# Patient Record
Sex: Female | Born: 1950 | Race: Black or African American | Hispanic: No | Marital: Married | Smoking: Never smoker
Health system: Southern US, Community
[De-identification: ages and names within clinical notes are randomized; demographics above are authoritative.]

---

## 2015-04-04 ENCOUNTER — Emergency Department (HOSPITAL_BASED_OUTPATIENT_CLINIC_OR_DEPARTMENT_OTHER): Payer: Self-pay

## 2015-04-04 ENCOUNTER — Emergency Department (HOSPITAL_BASED_OUTPATIENT_CLINIC_OR_DEPARTMENT_OTHER)
Admission: EM | Admit: 2015-04-04 | Discharge: 2015-04-04 | Disposition: A | Discharge status: Home / Self Care | Payer: Self-pay | Attending: Emergency Medicine | Admitting: Emergency Medicine

## 2015-04-04 ENCOUNTER — Encounter (HOSPITAL_BASED_OUTPATIENT_CLINIC_OR_DEPARTMENT_OTHER): Payer: Self-pay | Admitting: Emergency Medicine

## 2015-04-04 DIAGNOSIS — I10 Essential (primary) hypertension: Secondary | ICD-10-CM | POA: Insufficient documentation

## 2015-04-04 DIAGNOSIS — K2970 Gastritis, unspecified, without bleeding: Secondary | ICD-10-CM | POA: Insufficient documentation

## 2015-04-04 DIAGNOSIS — R002 Palpitations: Secondary | ICD-10-CM | POA: Insufficient documentation

## 2015-04-04 DIAGNOSIS — R634 Abnormal weight loss: Secondary | ICD-10-CM | POA: Insufficient documentation

## 2015-04-04 DIAGNOSIS — I951 Orthostatic hypotension: Secondary | ICD-10-CM | POA: Insufficient documentation

## 2015-04-04 LAB — COMPREHENSIVE METABOLIC PANEL
ALT: 20 U/L (ref 14–54)
ANION GAP: 9 (ref 5–15)
AST: 26 U/L (ref 15–41)
Albumin: 4.2 g/dL (ref 3.5–5.0)
Alkaline Phosphatase: 85 U/L (ref 38–126)
BILIRUBIN TOTAL: 0.2 mg/dL — AB (ref 0.3–1.2)
BUN: 15 mg/dL (ref 6–20)
CHLORIDE: 106 mmol/L (ref 101–111)
CO2: 28 mmol/L (ref 22–32)
Calcium: 9.1 mg/dL (ref 8.9–10.3)
Creatinine, Ser: 0.69 mg/dL (ref 0.44–1.00)
GFR calc Af Amer: >60 mL/min (ref >60)
Glucose, Bld: 101 mg/dL — ABNORMAL HIGH (ref 65–99)
POTASSIUM: 3.8 mmol/L (ref 3.5–5.1)
Sodium: 143 mmol/L (ref 135–145)
TOTAL PROTEIN: 8.6 g/dL — AB (ref 6.5–8.1)

## 2015-04-04 LAB — CBC WITH DIFFERENTIAL/PLATELET
BASOS ABS: 0 10*3/uL (ref 0.0–0.1)
BASOS PCT: 0 %
EOS PCT: 1 %
Eosinophils Absolute: 0.1 10*3/uL (ref 0.0–0.7)
HCT: 39.6 % (ref 36.0–46.0)
Hemoglobin: 12.9 g/dL (ref 12.0–15.0)
LYMPHS PCT: 23 %
Lymphs Abs: 2 10*3/uL (ref 0.7–4.0)
MCH: 26.4 pg (ref 26.0–34.0)
MCHC: 32.6 g/dL (ref 30.0–36.0)
MCV: 81 fL (ref 78.0–100.0)
MONO ABS: 0.6 10*3/uL (ref 0.1–1.0)
MONOS PCT: 7 %
Neutro Abs: 6 10*3/uL (ref 1.7–7.7)
Neutrophils Relative %: 69 %
PLATELETS: 223 10*3/uL (ref 150–400)
RBC: 4.89 MIL/uL (ref 3.87–5.11)
RDW: 13.6 % (ref 11.5–15.5)
WBC: 8.8 10*3/uL (ref 4.0–10.5)

## 2015-04-04 LAB — URINALYSIS, ROUTINE W REFLEX MICROSCOPIC
Bilirubin Urine: NEGATIVE
GLUCOSE, UA: NEGATIVE mg/dL
Hgb urine dipstick: NEGATIVE
KETONES UR: NEGATIVE mg/dL
LEUKOCYTES UA: NEGATIVE
NITRITE: NEGATIVE
PROTEIN: NEGATIVE mg/dL
Specific Gravity, Urine: 1.008 (ref 1.005–1.030)
pH: 6.5 (ref 5.0–8.0)

## 2015-04-04 LAB — LIPASE, BLOOD: LIPASE: 32 U/L (ref 11–51)

## 2015-04-04 LAB — TROPONIN I: Troponin I: <0.03 ng/mL (ref <0.031)

## 2015-04-04 MED ORDER — SODIUM CHLORIDE 0.9 % IV BOLUS (SEPSIS)
1000.0000 mL | Freq: Once | INTRAVENOUS | Status: AC
  Administered 2015-04-04: 1000 mL via INTRAVENOUS

## 2015-04-04 MED ORDER — OMEPRAZOLE 20 MG PO CPDR: 20.0000 mg | DELAYED_RELEASE_CAPSULE | Freq: Every day | ORAL | Status: DC

## 2015-04-04 MED ORDER — IOHEXOL 300 MG/ML  SOLN
25.0000 mL | Freq: Once | INTRAMUSCULAR | Status: AC | PRN
  Administered 2015-04-04: 25 mL via ORAL

## 2015-04-04 MED ORDER — IOHEXOL 300 MG/ML  SOLN
100.0000 mL | Freq: Once | INTRAMUSCULAR | Status: AC | PRN
  Administered 2015-04-04: 100 mL via INTRAVENOUS

## 2015-04-04 NOTE — ED Notes (Signed)
Up to b/r, steady gait, NAD, calm, interactive, family present.

## 2015-04-04 NOTE — ED Notes (Signed)
Pt placed on automatic vital signs Q30. 

## 2015-04-04 NOTE — ED Provider Notes (Signed)
CSN: [id number]     Arrival date & time 04/04/15  1515 History   First MD Initiated Contact with Patient 04/04/15 1542     Chief Complaint  Patient presents with  . Abdominal Pain    (Consider location/radiation/quality/duration/timing/severity/associated sxs/prior Treatment) HPI Comments: Patient who is from [place], no reported PMH but is hypertensive here today -- presents with multiple complaints. Main impetus for coming to the ED today was severe dizziness described as a spinning sensation last night which caused the patient not to be able to walk. This was associated with a feeling like her heart was beating rapidly. Also vomiting at times. No chest pain or shortness of breath. No lightheadedness or syncope. No treatments for this. Patient denies signs of stroke including: facial droop, slurred speech, aphasia, weakness/numbness in extremities.   Family also reports several months of decreased appetite, epigastric pain with eating. Pain is described as a burning and does not radiate. No associated fevers. Family reports weight loss but is unable to estimate how much. No change in bowel movements. No difficulty with urination.  Family member reports that patient had severe bleeding after having a tooth pulled in [place] several months ago.  Patient is a 65 y.o. female presenting with abdominal pain. The history is provided by the patient and a relative. The history is limited by a language barrier. A language interpreter was used (family).  Abdominal Pain Associated symptoms: vomiting   Associated symptoms: no chest pain, no cough, no diarrhea, no dysuria, no fever, no nausea, no shortness of breath and no sore throat     History reviewed. No pertinent past medical history. History reviewed. No pertinent past surgical history. History reviewed. No pertinent family history. Social History  Substance Use Topics  . Smoking status: Never Smoker   . Smokeless tobacco: None  . Alcohol Use: No    OB History    No data available     Review of Systems  Constitutional: Positive for activity change, appetite change and unexpected weight change. Negative for fever.  HENT: Negative for congestion, dental problem, rhinorrhea, sinus pressure and sore throat.   Eyes: Negative for photophobia, discharge, redness and visual disturbance.  Respiratory: Negative for cough and shortness of breath.   Cardiovascular: Positive for palpitations. Negative for chest pain.  Gastrointestinal: Positive for vomiting and abdominal pain. Negative for nausea and diarrhea.  Genitourinary: Negative for dysuria.  Musculoskeletal: Negative for myalgias, gait problem, neck pain and neck stiffness.  Skin: Negative for rash.  Neurological: Negative for syncope, speech difficulty, weakness, light-headedness, numbness and headaches.  Psychiatric/Behavioral: Negative for confusion.    Allergies  Review of patient's allergies indicates no known allergies.  Home Medications   Prior to Admission medications   Not on File   BP 196/115 mmHg  Pulse 83  Temp(Src) 98 F (36.7 C) (Oral)  Resp 18  Wt 58.514 kg  SpO2 100%   Physical Exam  Constitutional: She is oriented to person, place, and time. She appears well-developed and well-nourished.  HENT:  Head: Normocephalic and atraumatic.  Right Ear: Tympanic membrane, external ear and ear canal normal.  Left Ear: Tympanic membrane, external ear and ear canal normal.  Nose: Nose normal.  Mouth/Throat: Uvula is midline, oropharynx is clear and moist and mucous membranes are normal.  Eyes: Conjunctivae, EOM and lids are normal. Pupils are equal, round, and reactive to light. Right eye exhibits no discharge. Left eye exhibits no discharge. Right eye exhibits no nystagmus. Left eye exhibits no nystagmus.  Neck: Normal range of motion. Neck supple. No JVD present.  Cardiovascular: Normal rate, regular rhythm and normal heart sounds.   No murmur  heard. Pulmonary/Chest: Effort normal and breath sounds normal. No respiratory distress. She has no wheezes. She has no rales.  Abdominal: Soft. Bowel sounds are normal. There is no tenderness. There is no rebound and no guarding.  Musculoskeletal:       Cervical back: She exhibits normal range of motion, no tenderness and no bony tenderness.  Neurological: She is alert and oriented to person, place, and time. She has normal strength and normal reflexes. No cranial nerve deficit or sensory deficit. She displays a negative Romberg sign. Coordination and gait normal. GCS eye subscore is 4. GCS verbal subscore is 5. GCS motor subscore is 6.  Skin: Skin is warm and dry.  Psychiatric: She has a normal mood and affect.  Nursing note and vitals reviewed.   ED Course  Procedures (including critical care time) Labs Review Labs Reviewed  COMPREHENSIVE METABOLIC PANEL - Abnormal; Notable for the following:    Glucose, Bld 101 (*)    Total Protein 8.6 (*)    Total Bilirubin 0.2 (*)    All other components within normal limits  CBC WITH DIFFERENTIAL/PLATELET  LIPASE, BLOOD  URINALYSIS, ROUTINE W REFLEX MICROSCOPIC (NOT AT [facility])  TROPONIN I    Imaging Review No results found. I have personally reviewed and evaluated these images and lab results as part of my medical decision-making.   EKG Interpretation   Date/Time:  Sunday April 04 2015 16:28:31 EST Ventricular Rate:  89 PR Interval:  163 QRS Duration: 92 QT Interval:  384 QTC Calculation: 467 R Axis:   17 Text Interpretation:  Sinus rhythm Ventricular premature complex No  previous ECGs available Confirmed by Manus Gunning  MD, STEPHEN [phone]) on  04/04/2015 4:44:34 PM       4:13 PM Patient seen and examined. Work-up initiated. Medications ordered.   Vital signs reviewed and are as follows: BP 196/115 mmHg  Pulse 83  Temp(Src) 98 F (36.7 C) (Oral)  Resp 18  Wt 58.514 kg  SpO2 100%  5:55 PM Patient discussed with and seen by  Dr. Manus Gunning. CT head ordered. Labs reassuring. She ambulates well. Suspect she can go home if head CT is negative.   Pending completion of work-up.    MDM   Final diagnoses:  None   Pending completion of work-up. No concerning findings on labs.     Renne Crigler, PA-C 04/04/15 1800  Glynn Octave, MD 04/05/15 [phone]

## 2015-04-04 NOTE — ED Notes (Addendum)
Pt back from radiology, alert, NAD, calm, interactive, resps e/u, speaking in clear complete sentences, no dyspnea noted, skin W&D, BP remains elevated, VSS. (denies: sx or complaints). [facility]

## 2015-04-04 NOTE — ED Notes (Signed)
Appetite is poor, has a lot of nausea after eating.

## 2015-04-04 NOTE — ED Notes (Signed)
Pt not in room, pt in CT.  

## 2015-04-04 NOTE — ED Notes (Addendum)
Patient has had problems with burning and pain to her epigastric region for "a while"  - however in the last week she has had the symptoms worse and it burns when she eats. Patient also reports that she is no dizzy.

## 2015-04-04 NOTE — ED Notes (Signed)
Pt states she feels like her heart is beating fast and had some dizziness. No chest pain. Having a lot of belching

## 2015-04-04 NOTE — ED Provider Notes (Signed)
HPI Comments: Tracey Soto is a 65 y.o. female who presents to the Emergency Department due to a constant, mild, dizziness she describes as the "room spinning" with onset 3 weeks ago and recently worsened. Her relative notes associated, moderate, intermittent, HA, nausea, and epigastric pain s/p eating that her relative describes as "cramps" which she denies currently experiencing. Her relative denies a hx of cardiac issues. Tracey Soto's relative notes she is from [place] and has been in the [place] for 4 weeks and plans to return in February. Her relative denies any fevers, falls, LOC, diarrhea, vomiting, CP, regions of pain or other associated symptoms at this time.     Physical Exam RRR CTAB abd soft and nontender CN 2-12 intact, no ataxia on finger to nose, no nystagmus, 5/5 strength throughout, no pronator drift, Romberg negative, normal gait.  8:39 PM Discussed results of labs and imaging as well as next steps for treatment with Tracey Soto and her family. Tracey Soto strongly advised to be reevaluated before returning to [place]. Return precautions noted. Tracey Soto and her family understood and are agreeable to the plan.   CT head is negative. Blood pressure is improved to 150/96. Patient is tolerating by mouth and ambulatory. No focal neurological deficits.  CT abdominal results discussed with Dr. Carolynne Edouardoth of surgery. He feels this does not require emergent evaluation is likely transient. Patient with a benign abdominal exam and not vomiting. Instructed to return to the ED if she does have abdominal pain or vomiting. She will be referred to a primary care physician for evaluation before she returns to [place]. Start PPI.  I personally performed the services described in this documentation, which was scribed in my presence. The recorded information has been reviewed and is accurate.   Glynn OctaveStephen Jo-Anne Kluth, MD 04/04/15 2330

## 2015-04-04 NOTE — Discharge Instructions (Signed)
Orthostatic Hypotension Keep yourself hydrated, take the stomach medication as prescribed. Follow up with a primary care doctor. Return to the ED if he develop abdominal pain, vomiting or any other concerns. Orthostatic hypotension is a sudden drop in blood pressure. It happens when you quickly stand up from a seated or lying position. You may feel dizzy or light-headed. This can last for just a few seconds or for up to a few minutes. It is usually not a serious problem. However, if this happens frequently or gets worse, it can be a sign of something more serious. CAUSES  Different things can cause orthostatic hypotension, including:   Loss of body fluids (dehydration).  Medicines that lower blood pressure.  Sudden changes in posture, such as standing up quickly after you have been sitting or lying down.  Taking too much of your medicine. SIGNS AND SYMPTOMS   Light-headedness or dizziness.   Fainting or near-fainting.   A fast heart rate.   Weakness.   Feeling tired (fatigue).  DIAGNOSIS  Your health care provider may do several things to help diagnose your condition and identify the cause. These may include:   Taking a medical history and doing a physical exam.  Checking your blood pressure. Your health care provider will check your blood pressure when you are:  Lying down.  Sitting.  Standing.  Using tilt table testing. In this test, you lie down on a table that moves from a lying position to a standing position. You will be strapped onto the table. This test monitors your blood pressure and heart rate when you are in different positions. TREATMENT  Treatment will vary depending on the cause. Possible treatments include:   Changing the dosage of your medicines.  Wearing compression stockings on your lower legs.  Standing up slowly after sitting or lying down.  Eating more salt.  Eating frequent, small meals.  In some cases, getting IV fluids.  Taking  medicine to enhance fluid retention. HOME CARE INSTRUCTIONS  Only take over-the-counter or prescription medicines as directed by your health care provider.  Follow your health care provider's instructions for changing the dosage of your current medicines.  Do not stop or adjust your medicine on your own.  Stand up slowly after sitting or lying down. This allows your body to adjust to the different position.  Wear compression stockings as directed.  Eat extra salt as directed.  Do not add extra salt to your diet unless directed to by your health care provider.  Eat frequent, small meals.  Avoid standing suddenly after eating.  Avoid hot showers or excessive heat as directed by your health care provider.  Keep all follow-up appointments. SEEK MEDICAL CARE IF:  You continue to feel dizzy or light-headed after standing.  You feel groggy or confused.  You feel cold, clammy, or sick to your stomach (nauseous).  You have blurred vision.  You feel short of breath. SEEK IMMEDIATE MEDICAL CARE IF:   You faint after standing.  You have chest pain.  You have difficulty breathing.   You lose feeling or movement in your arms or legs.   You have slurred speech or difficulty talking, or you are unable to talk.  MAKE SURE YOU:   Understand these instructions.  Will watch your condition.  Will get help right away if you are not doing well or get worse.   This information is not intended to replace advice given to you by your health care provider. Make sure you discuss  any questions you have with your health care provider.   Document Released: 02/24/2002 Document Revised: 03/11/2013 Document Reviewed: 12/27/2012 Elsevier Interactive Patient Education [employer]

## 2015-05-01 ENCOUNTER — Encounter (HOSPITAL_BASED_OUTPATIENT_CLINIC_OR_DEPARTMENT_OTHER): Payer: Self-pay | Admitting: *Deleted

## 2015-05-01 ENCOUNTER — Emergency Department (HOSPITAL_BASED_OUTPATIENT_CLINIC_OR_DEPARTMENT_OTHER)
Admission: EM | Admit: 2015-05-01 | Discharge: 2015-05-01 | Disposition: A | Discharge status: Home / Self Care | Payer: Self-pay | Attending: Emergency Medicine | Admitting: Emergency Medicine

## 2015-05-01 DIAGNOSIS — Z79899 Other long term (current) drug therapy: Secondary | ICD-10-CM | POA: Insufficient documentation

## 2015-05-01 DIAGNOSIS — T39395A Adverse effect of other nonsteroidal anti-inflammatory drugs [NSAID], initial encounter: Secondary | ICD-10-CM | POA: Insufficient documentation

## 2015-05-01 DIAGNOSIS — K2970 Gastritis, unspecified, without bleeding: Secondary | ICD-10-CM | POA: Insufficient documentation

## 2015-05-01 DIAGNOSIS — K2960 Other gastritis without bleeding: Secondary | ICD-10-CM

## 2015-05-01 DIAGNOSIS — H81399 Other peripheral vertigo, unspecified ear: Secondary | ICD-10-CM | POA: Insufficient documentation

## 2015-05-01 LAB — HEPATIC FUNCTION PANEL
ALT: 20 U/L (ref 14–54)
AST: 27 U/L (ref 15–41)
Albumin: 4.5 g/dL (ref 3.5–5.0)
Alkaline Phosphatase: 85 U/L (ref 38–126)
BILIRUBIN INDIRECT: 0.5 mg/dL (ref 0.3–0.9)
Bilirubin, Direct: 0.1 mg/dL (ref 0.1–0.5)
TOTAL PROTEIN: 9.1 g/dL — AB (ref 6.5–8.1)
Total Bilirubin: 0.6 mg/dL (ref 0.3–1.2)

## 2015-05-01 LAB — CBC WITH DIFFERENTIAL/PLATELET
BAND NEUTROPHILS: 0 %
BLASTS: 0 %
Basophils Absolute: 0 10*3/uL (ref 0.0–0.1)
Basophils Relative: 0 %
EOS ABS: 0 10*3/uL (ref 0.0–0.7)
EOS PCT: 0 %
HEMATOCRIT: 37.1 % (ref 36.0–46.0)
Hemoglobin: 12.3 g/dL (ref 12.0–15.0)
LYMPHS ABS: 0.8 10*3/uL (ref 0.7–4.0)
LYMPHS PCT: 8 %
MCH: 26 pg (ref 26.0–34.0)
MCHC: 33.2 g/dL (ref 30.0–36.0)
MCV: 78.4 fL (ref 78.0–100.0)
MONOS PCT: 1 %
Metamyelocytes Relative: 0 %
Monocytes Absolute: 0.1 10*3/uL (ref 0.1–1.0)
Myelocytes: 0 %
NEUTROS ABS: 9.4 10*3/uL — AB (ref 1.7–7.7)
NEUTROS PCT: 91 %
OTHER: 0 %
Platelets: 196 10*3/uL (ref 150–400)
Promyelocytes Absolute: 0 %
RBC: 4.73 MIL/uL (ref 3.87–5.11)
RDW: 13.6 % (ref 11.5–15.5)
WBC: 10.3 10*3/uL (ref 4.0–10.5)
nRBC: 0 /100 WBC

## 2015-05-01 LAB — URINE MICROSCOPIC-ADD ON

## 2015-05-01 LAB — BASIC METABOLIC PANEL
Anion gap: 10 (ref 5–15)
BUN: 17 mg/dL (ref 6–20)
CO2: 27 mmol/L (ref 22–32)
Calcium: 9.1 mg/dL (ref 8.9–10.3)
Chloride: 103 mmol/L (ref 101–111)
Creatinine, Ser: 0.76 mg/dL (ref 0.44–1.00)
Glucose, Bld: 124 mg/dL — ABNORMAL HIGH (ref 65–99)
POTASSIUM: 3.8 mmol/L (ref 3.5–5.1)
SODIUM: 140 mmol/L (ref 135–145)

## 2015-05-01 LAB — URINALYSIS, ROUTINE W REFLEX MICROSCOPIC
Bilirubin Urine: NEGATIVE
Glucose, UA: NEGATIVE mg/dL
Ketones, ur: NEGATIVE mg/dL
Leukocytes, UA: NEGATIVE
Nitrite: NEGATIVE
PROTEIN: NEGATIVE mg/dL
Specific Gravity, Urine: 1.014 (ref 1.005–1.030)
pH: 7.5 (ref 5.0–8.0)

## 2015-05-01 LAB — CBG MONITORING, ED: GLUCOSE-CAPILLARY: 95 mg/dL (ref 65–99)

## 2015-05-01 LAB — LIPASE, BLOOD: Lipase: 28 U/L (ref 11–51)

## 2015-05-01 MED ORDER — SUCRALFATE 1 G PO TABS: 1.0000 g | ORAL_TABLET | Freq: Three times a day (TID) | ORAL | Status: DC

## 2015-05-01 MED ORDER — MECLIZINE HCL 25 MG PO TABS: 25.0000 mg | ORAL_TABLET | Freq: Three times a day (TID) | ORAL | Status: DC | PRN

## 2015-05-01 NOTE — ED Notes (Signed)
Pt reports dizziness and nausea/vomiting that started yesterday.  Hx of same.  Ambulatory, no deficits noted.

## 2015-05-01 NOTE — ED Notes (Signed)
DC instructions reviewed with son, mother only speaks french, rx x 2 reviewed, discussed possible follow up with ENT MD as recommended by EDP, also stressed importance to discontinue use if ibuprofen and OTC NSAIDS and to use Maalox or Mylanta for indigestion per EDP orders, Son offered opportunity for questions and to clarify information provided. Teach Back Method used

## 2015-05-01 NOTE — Discharge Instructions (Signed)
Stop using ibuprofen and all NSAIDs. May take over-the-counter Maalox or Mylanta for upper abdominal pain. Follow-up with ear nose and throat M.D. Take medications as prescribed for dizziness.  Vertigo Vertigo means you feel like you or your surroundings are moving when they are not. Vertigo can be dangerous if it occurs when you are at work, driving, or performing difficult activities.  CAUSES  Vertigo occurs when there is a conflict of signals sent to your brain from the visual and sensory systems in your body. There are many different causes of vertigo, including:  Infections, especially in the inner ear.  A bad reaction to a drug or misuse of alcohol and medicines.  Withdrawal from drugs or alcohol.  Rapidly changing positions, such as lying down or rolling over in bed.  A migraine headache.  Decreased blood flow to the brain.  Increased pressure in the brain from a head injury, infection, tumor, or bleeding. SYMPTOMS  You may feel as though the world is spinning around or you are falling to the ground. Because your balance is upset, vertigo can cause nausea and vomiting. You may have involuntary eye movements (nystagmus). DIAGNOSIS  Vertigo is usually diagnosed by physical exam. If the cause of your vertigo is unknown, your caregiver may perform imaging tests, such as an MRI scan (magnetic resonance imaging). TREATMENT  Most cases of vertigo resolve on their own, without treatment. Depending on the cause, your caregiver may prescribe certain medicines. If your vertigo is related to body position issues, your caregiver may recommend movements or procedures to correct the problem. In rare cases, if your vertigo is caused by certain inner ear problems, you may need surgery. HOME CARE INSTRUCTIONS   Follow your caregiver's instructions.  Avoid driving.  Avoid operating heavy machinery.  Avoid performing any tasks that would be dangerous to you or others during a vertigo  episode.  Tell your caregiver if you notice that certain medicines seem to be causing your vertigo. Some of the medicines used to treat vertigo episodes can actually make them worse in some people. SEEK IMMEDIATE MEDICAL CARE IF:   Your medicines do not relieve your vertigo or are making it worse.  You develop problems with talking, walking, weakness, or using your arms, hands, or legs.  You develop severe headaches.  Your nausea or vomiting continues or gets worse.  You develop visual changes.  A family member notices behavioral changes.  Your condition gets worse. MAKE SURE YOU:  Understand these instructions.  Will watch your condition.  Will get help right away if you are not doing well or get worse.   This information is not intended to replace advice given to you by your health care provider. Make sure you discuss any questions you have with your health care provider.   Document Released: 12/14/2004 Document Revised: 05/29/2011 Document Reviewed: 06/29/2014 Elsevier Interactive Patient Education 2016 [employer] Inc[employer]  Gastritis, Adult Gastritis is soreness and swelling (inflammation) of the lining of the stomach. Gastritis can develop as a sudden onset (acute) or long-term (chronic) condition. If gastritis is not treated, it can lead to stomach bleeding and ulcers. CAUSES  Gastritis occurs when the stomach lining is weak or damaged. Digestive juices from the stomach then inflame the weakened stomach lining. The stomach lining may be weak or damaged due to viral or bacterial infections. One common bacterial infection is the Helicobacter pylori infection. Gastritis can also result from excessive alcohol consumption, taking certain medicines, or having too much acid in the  stomach.  SYMPTOMS  In some cases, there are no symptoms. When symptoms are present, they may include:  Pain or a burning sensation in the upper abdomen.  Nausea.  Vomiting.  An uncomfortable feeling of  fullness after eating. DIAGNOSIS  Your caregiver may suspect you have gastritis based on your symptoms and a physical exam. To determine the cause of your gastritis, your caregiver may perform the following:  Blood or stool tests to check for the H pylori bacterium.  Gastroscopy. A thin, flexible tube (endoscope) is passed down the esophagus and into the stomach. The endoscope has a light and camera on the end. Your caregiver uses the endoscope to view the inside of the stomach.  Taking a tissue sample (biopsy) from the stomach to examine under a microscope. TREATMENT  Depending on the cause of your gastritis, medicines may be prescribed. If you have a bacterial infection, such as an H pylori infection, antibiotics may be given. If your gastritis is caused by too much acid in the stomach, H2 blockers or antacids may be given. Your caregiver may recommend that you stop taking aspirin, ibuprofen, or other nonsteroidal anti-inflammatory drugs (NSAIDs). HOME CARE INSTRUCTIONS  Only take over-the-counter or prescription medicines as directed by your caregiver.  If you were given antibiotic medicines, take them as directed. Finish them even if you start to feel better.  Drink enough fluids to keep your urine clear or pale yellow.  Avoid foods and drinks that make your symptoms worse, such as:  Caffeine or alcoholic drinks.  Chocolate.  Peppermint or mint flavorings.  Garlic and onions.  Spicy foods.  Citrus fruits, such as oranges, lemons, or limes.  Tomato-based foods such as sauce, chili, salsa, and pizza.  Fried and fatty foods.  Eat small, frequent meals instead of large meals. SEEK IMMEDIATE MEDICAL CARE IF:   You have black or dark red stools.  You vomit blood or material that looks like coffee grounds.  You are unable to keep fluids down.  Your abdominal pain gets worse.  You have a fever.  You do not feel better after 1 week.  You have any other questions or  concerns. MAKE SURE YOU:  Understand these instructions.  Will watch your condition.  Will get help right away if you are not doing well or get worse.   This information is not intended to replace advice given to you by your health care provider. Make sure you discuss any questions you have with your health care provider.   Document Released: 02/28/2001 Document Revised: 09/05/2011 Document Reviewed: 04/19/2011 Elsevier Interactive Patient Education [employer]

## 2015-05-01 NOTE — ED Provider Notes (Signed)
CSN: [id number]     Arrival date & time 05/01/15  1510 History  By signing my name below, I, Tracey Soto, attest that this documentation has been prepared under the direction and in the presence of Tracey Racer, MD. Electronically Signed: Budd Soto, ED Scribe. 05/01/2015. 4:05 PM.     Chief Complaint  Patient presents with  . Dizziness   The history is provided by the patient and a relative. No language interpreter was used.   HPI Comments: Tracey Soto is a 65 y.o. female who presents to the Emergency Department complaining of intermittent dizziness (room spinning) onset a few weeks ago, worsening significantly as of 1 day ago. She reports associated vomiting, nausea, and loss of appetite. Per relative, pt reports exacerbation of the dizziness with standing up and feeling "hot in her abdomen" after eating. He states pt has taken antibiotics a few weeks ago due to dental surgery, and has been taking ibuprofen for pain since then. He notes pt has a PMHx of the same that resolved after a few months, but has now returned. He notes pt does not have a PCP. Pt denies tinnitus, sinus pressure, and congestion.    History reviewed. No pertinent past medical history. History reviewed. No pertinent past surgical history. History reviewed. No pertinent family history. Social History  Substance Use Topics  . Smoking status: Never Smoker   . Smokeless tobacco: None  . Alcohol Use: No   OB History    No data available     Review of Systems  Constitutional: Positive for appetite change. Negative for fever and chills.  HENT: Negative for congestion, ear pain, hearing loss, sinus pressure and tinnitus.   Respiratory: Negative for shortness of breath.   Cardiovascular: Negative for chest pain.  Gastrointestinal: Positive for nausea, vomiting and abdominal pain. Negative for diarrhea and constipation.  Musculoskeletal: Negative for myalgias, neck pain and neck stiffness.  Skin: Negative for  rash and wound.  Neurological: Positive for dizziness. Negative for syncope, weakness, light-headedness and numbness.  All other systems reviewed and are negative.   Allergies  Review of patient's allergies indicates no known allergies.  Home Medications   Prior to Admission medications   Medication Sig Start Date End Date Taking? Authorizing Provider  meclizine (ANTIVERT) 25 MG tablet Take 1 tablet (25 mg total) by mouth 3 (three) times daily as needed for dizziness or nausea. 05/01/15   Tracey Racer, MD  omeprazole (PRILOSEC) 20 MG capsule Take 1 capsule (20 mg total) by mouth daily. 04/04/15   Tracey Octave, MD  sucralfate (CARAFATE) 1 g tablet Take 1 tablet (1 g total) by mouth 3 (three) times daily with meals. 05/01/15   Tracey Racer, MD   BP 165/86 mmHg  Pulse 64  Temp(Src) 97.5 F (36.4 C) (Oral)  Resp 16  SpO2 100% Physical Exam  Constitutional: She is oriented to person, place, and time. She appears well-developed and well-nourished. No distress.  HENT:  Head: Normocephalic and atraumatic.  Right Ear: External ear normal.  Left Ear: External ear normal.  Mouth/Throat: Oropharynx is clear and moist. No oropharyngeal exudate.  No sinus tenderness with percussion  Eyes: EOM are normal. Pupils are equal, round, and reactive to light.  Fatigable rotary nystagmus  Neck: Normal range of motion. Neck supple.  No bruits. No meningismus  Cardiovascular: Normal rate and regular rhythm.  Exam reveals no gallop and no friction rub.   No murmur heard. Pulmonary/Chest: Effort normal and breath sounds normal. No respiratory distress. She  has no wheezes. She has no rales. She exhibits no tenderness.  Abdominal: Soft. Bowel sounds are normal. She exhibits no distension and no mass. There is no tenderness. There is no rebound and no guarding.  Musculoskeletal: Normal range of motion. She exhibits no edema or tenderness.  No lower extremity swelling or pain. Distal pulses equal and  intact.  Neurological: She is alert and oriented to person, place, and time.  Patient is alert and oriented x3 with clear, goal oriented speech. Patient has 5/5 motor in all extremities. Sensation is intact to light touch. Bilateral finger-to-nose is normal with no signs of dysmetria.   Skin: Skin is warm and dry. No rash noted. No erythema.  Psychiatric: She has a normal mood and affect. Her behavior is normal.  Nursing note and vitals reviewed.   ED Course  Procedures  DIAGNOSTIC STUDIES: Oxygen Saturation is 97% on RA, adequate by my interpretation.    COORDINATION OF CARE: 3:52 PM - Discussed probable vertigo, as well as gastritis possibly due to the ibuprofen. Discussed plans to order diagnostic studies. Will refer to an HENT specialist. Pt advised of plan for treatment and pt agrees.  Labs Review Labs Reviewed  URINALYSIS, ROUTINE W REFLEX MICROSCOPIC (NOT AT [facility]) - Abnormal; Notable for the following:    Hgb urine dipstick TRACE (*)    All other components within normal limits  BASIC METABOLIC PANEL - Abnormal; Notable for the following:    Glucose, Bld 124 (*)    All other components within normal limits  HEPATIC FUNCTION PANEL - Abnormal; Notable for the following:    Total Protein 9.1 (*)    All other components within normal limits  CBC WITH DIFFERENTIAL/PLATELET - Abnormal; Notable for the following:    Neutro Abs 9.4 (*)    All other components within normal limits  URINE MICROSCOPIC-ADD ON - Abnormal; Notable for the following:    Squamous Epithelial / LPF 0-5 (*)    Bacteria, UA FEW (*)    All other components within normal limits  LIPASE, BLOOD  CBG MONITORING, ED    Imaging Review No results found. I have personally reviewed and evaluated these images and lab results as part of my medical decision-making.   EKG Interpretation None      MDM   Final diagnoses:  Peripheral vertigo, unspecified laterality  NSAID induced gastritis    I personally  performed the services described in this documentation, which was scribed in my presence. The recorded information has been reviewed and is accurate.   No dizziness with standing. Patient is ambulating well. Suspect that she has peripheral vertigo. We'll treat with meclizine and refer her to ENT should her symptoms persist. Regarding her upper abdominal pain. This is likely gastritis related to her recent NSAID use. She's been prescribed a PPI and has been advised to stop using NSAIDs. We'll give prescription for Carafate should over-the-counter Mylanta or Maalox be ineffective. Return precautions have been given.  Tracey Racer, MD 05/01/15 [phone]

## 2017-05-29 IMAGING — CT CT ABD-PELV W/ CM
2 of 5 series · 15 of 46 positions shown, 17 images · IV contrast (APPLIED)
Comparison: None.

CLINICAL DATA: Nausea and epigastric pain.  Weight loss.

EXAM:
CT ABDOMEN AND PELVIS WITH CONTRAST
TECHNIQUE: Multidetector CT imaging of the abdomen and pelvis was performed
using the standard protocol following bolus administration of
intravenous contrast. Oral contrast was also administered.
CONTRAST:  25mL OMNIPAQUE IOHEXOL 300 MG/ML SOLN, 100mL OMNIPAQUE
IOHEXOL 300 MG/ML SOLN

[Series 2: axial st · axial · 0.86mm/px · z∈[-476,-92]mm · 12 of 87 slices shown, 14 images]
[im 5/87  soft-tissue]
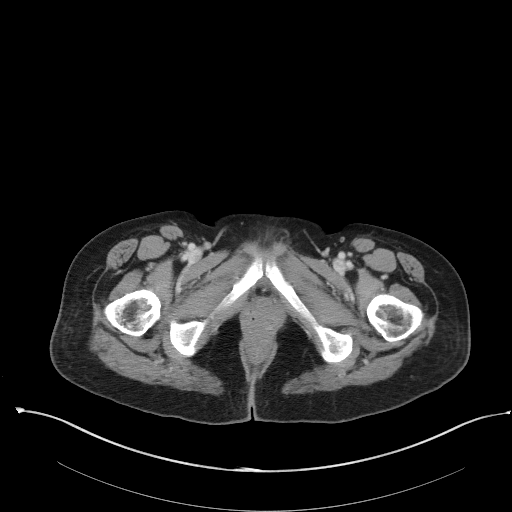
[im 5/87  bone]
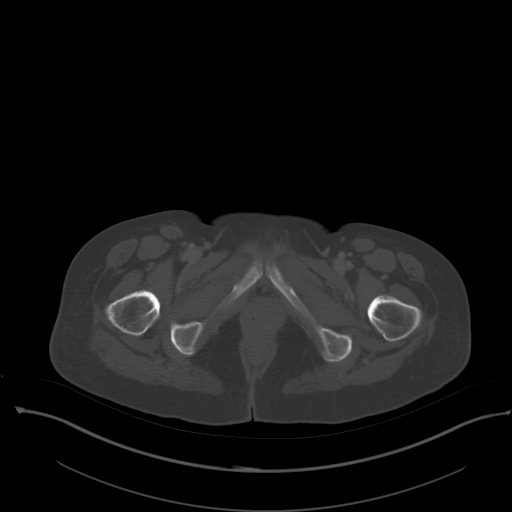
[im 13/87  soft-tissue]
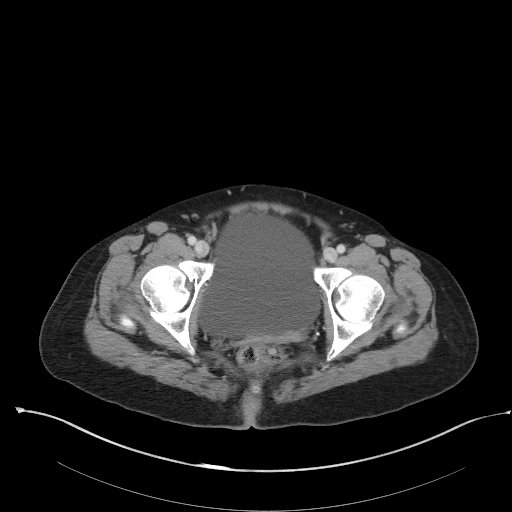
[im 18/87  soft-tissue]
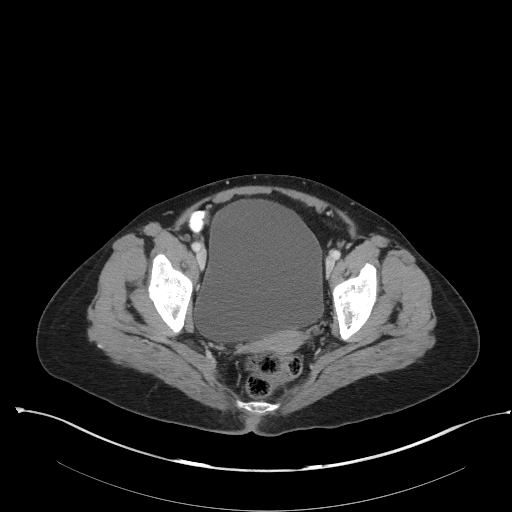
[im 26/87  soft-tissue]
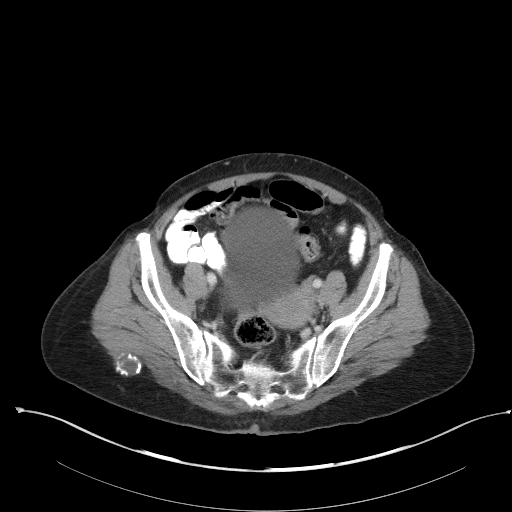
[im 35/87  soft-tissue]
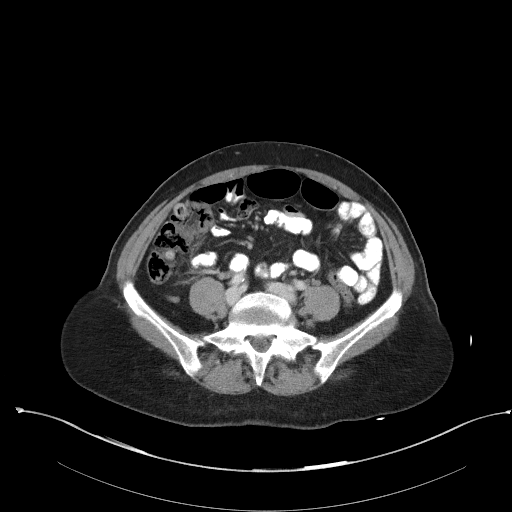
[im 39/87  soft-tissue]
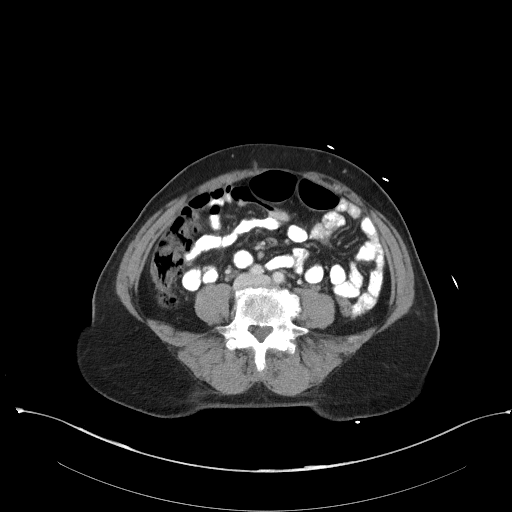
[im 48/87  soft-tissue]
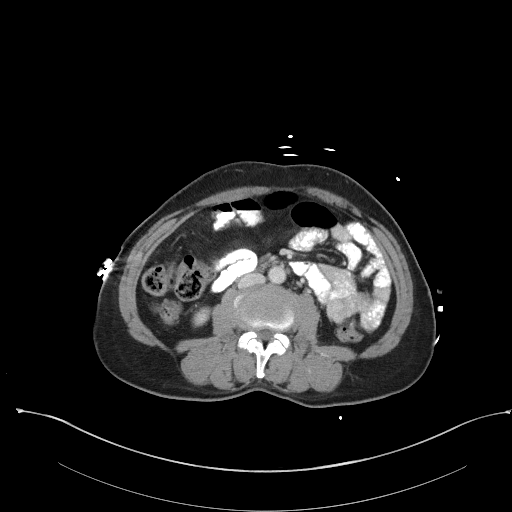
[im 52/87  soft-tissue]
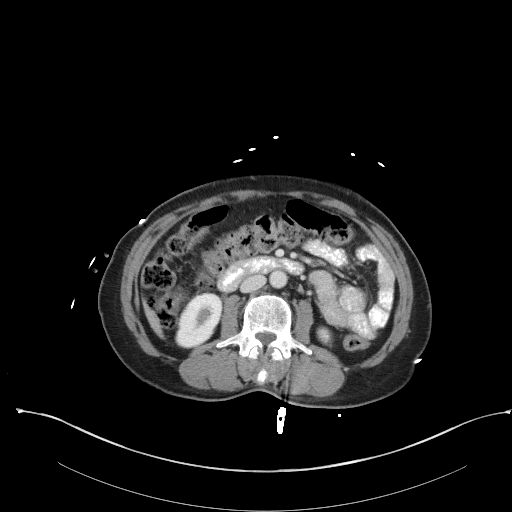
[im 61/87  soft-tissue]
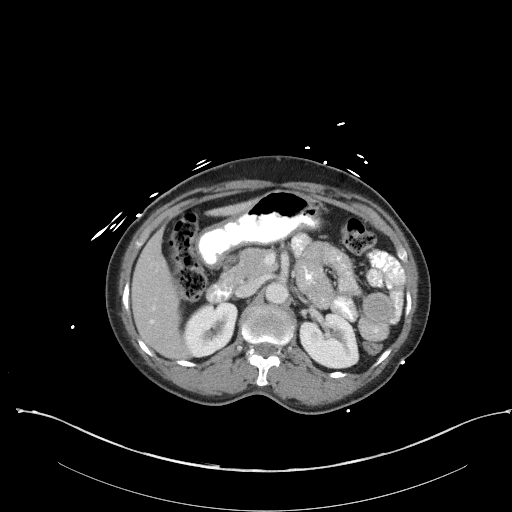
[im 61/87  bone]
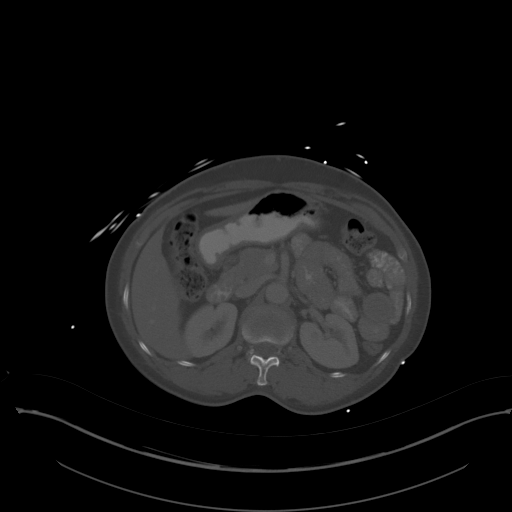
[im 69/87  soft-tissue]
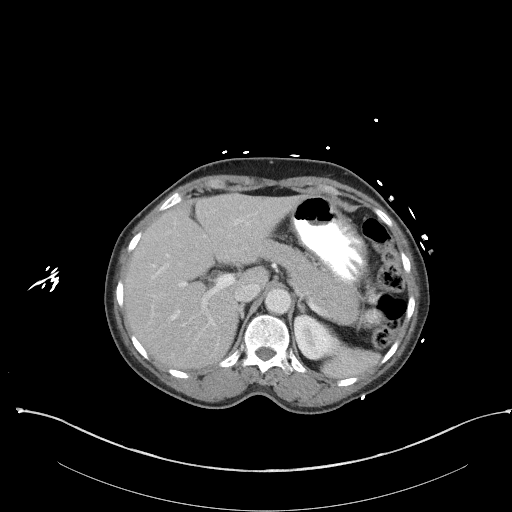
[im 74/87  soft-tissue]
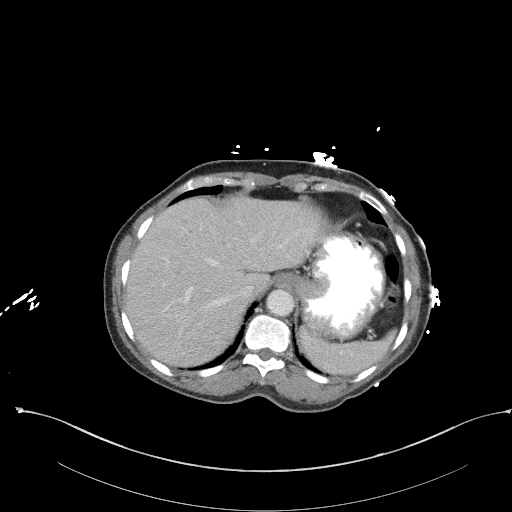
[im 82/87  soft-tissue]
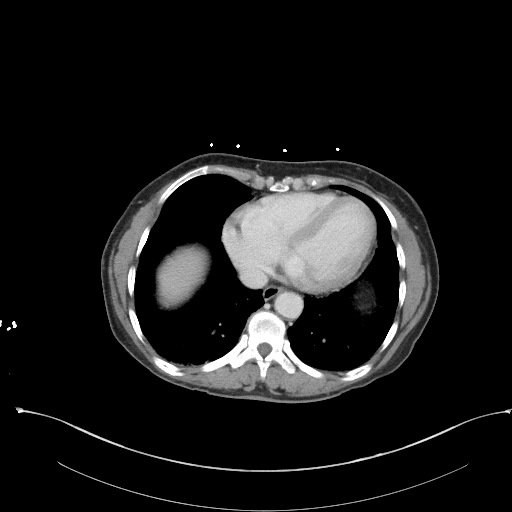

[Series 5: coronal st · coronal · 0.78mm/px · 3 of 74 slices shown]
[im 25/74  soft-tissue]
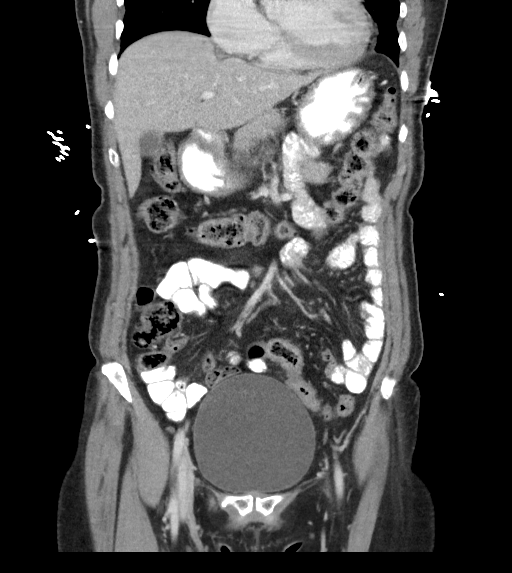
[im 33/74  soft-tissue]
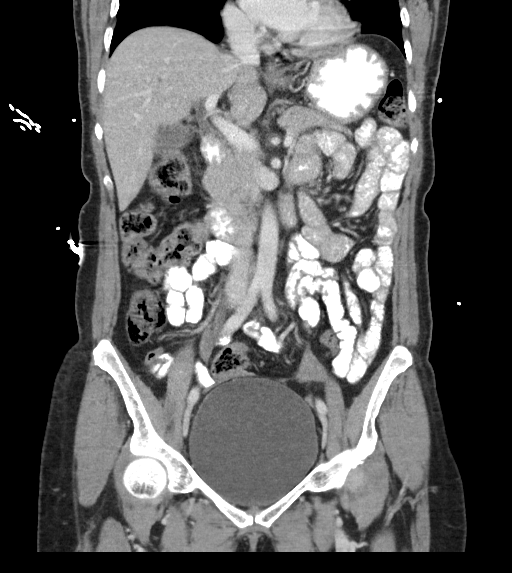
[im 41/74  soft-tissue]
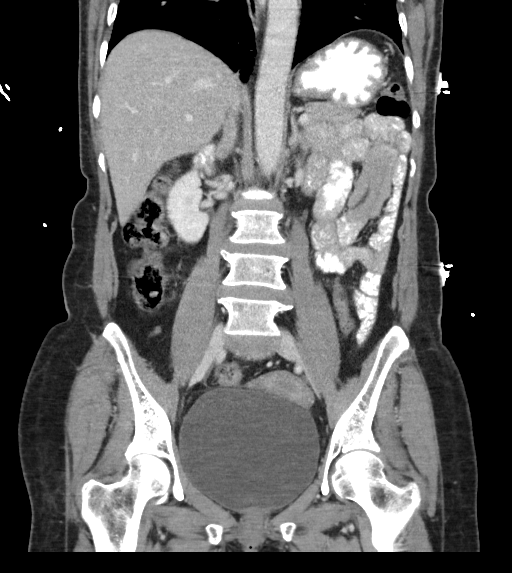

[15 of 46 positions shown; findings below may reference images not displayed]

FINDINGS: Lower chest: There is bibasilar lung atelectasis. Lung bases are
otherwise clear.

Hepatobiliary: There is a 5 mm probable cyst in the medial dome of
the liver on the right. There is a 5 mm probable cyst in the
anterior segment right lobe of the liver near the dome. There are
several 3-4 mm probable tiny cysts in the right and left lobes of
the liver. There is a 6 mm cyst in the medial segment of the left
lobe of the liver. No larger liver lesions are identified.
Gallbladder wall is not appreciably thickened. There is no biliary
duct dilatation.

Pancreas: No pancreatic mass or inflammatory focus.

Spleen: No splenic lesions are identified.

Adrenals/Urinary Tract: Adrenals appear normal bilaterally. Kidneys
bilaterally show no apparent mass or hydronephrosis on either side.
There is no renal or ureteral calculus on either side. The urinary
bladder is midline with normal wall thickness.

Stomach/Bowel: There is a localized area of intussusception in the
proximal jejunum on the left. Contrast extends through this area
without obstruction. There is no other small bowel are large bowel
thickening. There is wall thickening at the level of the distal
stomach extending into the proximal duodenum. No fistula or obvious
ulceration is seen in the distal stomach or proximal duodenum
regions. No free air or portal venous air. No bowel obstruction.

Vascular/Lymphatic: There is no demonstrable abdominal aortic
aneurysm. Major mesenteric vessels appear patent. There is no
demonstrable adenopathy in the abdomen or pelvis.

Reproductive: Uterus is anteverted. There is no pelvic mass or
pelvic fluid collection.

Other: The appendix appears normal. No ascites or abscess in the
abdomen or pelvis.

Musculoskeletal: There is a 2.6 x 2.3 cm cystic area with rim-like
calcification arising at the level of the right gluteus medius
muscle. No other intramuscular lesion is identified. No abdominal
wall lesions are identified. There are no blastic or lytic bone
lesions.
IMPRESSION: Localized area of intussusception in the proximal jejunum in the
left mid abdomen. No bowel obstruction; contrast passes through this
area. This significance of this area of intussusception is
uncertain. This may be a transient phenomenon, possibly associated
with a viral type enteritis. If symptoms persist, this finding may
warrant dedicated small bowel study or CT enterography to further
evaluate.

Wall thickening in the distal stomach and proximal duodenum, likely
indicative of distal gastritis and duodenitis. No ulceration or
fistula is seen by CT in these areas.

No bowel obstruction. No free air. No abscess. Appendix appears
normal.

No renal or ureteral calculus.  No hydronephrosis.

Cystic area with rim-like calcification at the level of the right
gluteus medius muscle, probably residua of old trauma.

## 2020-11-17 ENCOUNTER — Ambulatory Visit: Payer: Self-pay | Admitting: Family Medicine

## 2020-12-01 ENCOUNTER — Ambulatory Visit (INDEPENDENT_AMBULATORY_CARE_PROVIDER_SITE_OTHER): Payer: Self-pay | Admitting: Family Medicine

## 2020-12-01 ENCOUNTER — Other Ambulatory Visit: Payer: Self-pay

## 2020-12-01 ENCOUNTER — Encounter: Payer: Self-pay | Admitting: Family Medicine

## 2020-12-01 VITALS — BP 173/91 | HR 74 | Temp 98.1°F | Resp 18 | Ht 63.0 in | Wt 136.6 lb

## 2020-12-01 DIAGNOSIS — Z7689 Persons encountering health services in other specified circumstances: Secondary | ICD-10-CM

## 2020-12-01 DIAGNOSIS — I10 Essential (primary) hypertension: Secondary | ICD-10-CM

## 2020-12-01 DIAGNOSIS — Z789 Other specified health status: Secondary | ICD-10-CM

## 2020-12-01 MED ORDER — LISINOPRIL 20 MG PO TABS: 20.0000 mg | ORAL_TABLET | Freq: Every day | ORAL | 0 refills | Status: DC

## 2020-12-01 NOTE — Progress Notes (Signed)
Patient is here to establish care.

## 2020-12-03 NOTE — Progress Notes (Signed)
New Patient Office Visit  Subjective:  Patient ID: Tracey Soto, female    DOB: [birth year]  Age: 70 y.o. MRN: [id number]  CC:  Chief Complaint  Patient presents with   Establish Care   Hypertension    HPI Tracey Soto presents for to establish care.  This office visit was aided by an interpreter.  Patient reports having elevated blood pressure and requesting medication for such.  Has not had medications for hypertension.    History reviewed. No pertinent past medical history.  History reviewed. No pertinent surgical history.  History reviewed. No pertinent family history.  Social History   Socioeconomic History   Marital status: Married    Spouse name: Not on file   Number of children: Not on file   Years of education: Not on file   Highest education level: Not on file  Occupational History   Not on file  Tobacco Use   Smoking status: Never   Smokeless tobacco: Never  Substance and Sexual Activity   Alcohol use: No   Drug use: No   Sexual activity: Not on file  Other Topics Concern   Not on file  Social History Narrative   Not on file   Social Determinants of Health   Financial Resource Strain: Not on file  Food Insecurity: Not on file  Transportation Needs: Not on file  Physical Activity: Not on file  Stress: Not on file  Social Connections: Not on file  Intimate Partner Violence: Not on file    ROS Review of Systems  All other systems reviewed and are negative.  Objective:   Today's Vitals: BP (!) 173/91 (BP Location: Left Arm, Patient Position: Sitting, Cuff Size: Normal)   Pulse 74   Temp 98.1 F (36.7 C)   Resp 18   Ht 5\' 3"  (1.6 m)   Wt 136 lb 9.6 oz (62 kg)   SpO2 96%   BMI 24.20 kg/m   Physical Exam Vitals and nursing note reviewed.  Constitutional:      General: She is not in acute distress. Cardiovascular:     Rate and Rhythm: Normal rate and regular rhythm.  Pulmonary:     Effort: Pulmonary effort is normal.     Breath  sounds: Normal breath sounds.  Abdominal:     Palpations: Abdomen is soft.     Tenderness: There is no abdominal tenderness.  Musculoskeletal:     Right lower leg: No edema.     Left lower leg: No edema.  Neurological:     Mental Status: She is alert.    Assessment & Plan:    1. Uncontrolled hypertension Patient prescribed lisinopril 20 mg daily.  Will monitor. - lisinopril (ZESTRIL) 20 MG tablet; Take 1 tablet (20 mg total) by mouth daily.  Dispense: 30 tablet; Refill: 0  2. Encounter to establish care    Outpatient Encounter Medications as of 12/01/2020  Medication Sig   lisinopril (ZESTRIL) 20 MG tablet Take 1 tablet (20 mg total) by mouth daily.   meclizine (ANTIVERT) 25 MG tablet Take 1 tablet (25 mg total) by mouth 3 (three) times daily as needed for dizziness or nausea. (Patient not taking: Reported on 12/01/2020)   omeprazole (PRILOSEC) 20 MG capsule Take 1 capsule (20 mg total) by mouth daily. (Patient not taking: Reported on 12/01/2020)   sucralfate (CARAFATE) 1 g tablet Take 1 tablet (1 g total) by mouth 3 (three) times daily with meals. (Patient not taking: Reported on 12/01/2020)   No facility-administered  encounter medications on file as of 12/01/2020.    Follow-up: Return in about 3 weeks (around 12/22/2020) for follow up.   Tommie Raymond, MD

## 2020-12-24 ENCOUNTER — Ambulatory Visit (INDEPENDENT_AMBULATORY_CARE_PROVIDER_SITE_OTHER): Payer: Self-pay | Admitting: Family Medicine

## 2020-12-24 ENCOUNTER — Other Ambulatory Visit: Payer: Self-pay

## 2020-12-24 ENCOUNTER — Encounter: Payer: Self-pay | Admitting: Family Medicine

## 2020-12-24 DIAGNOSIS — I10 Essential (primary) hypertension: Secondary | ICD-10-CM

## 2020-12-24 MED ORDER — AMLODIPINE BESYLATE 10 MG PO TABS: 10.0000 mg | ORAL_TABLET | Freq: Every day | ORAL | 0 refills | Status: DC

## 2020-12-24 MED ORDER — LISINOPRIL 20 MG PO TABS: 20.0000 mg | ORAL_TABLET | Freq: Every day | ORAL | 0 refills | Status: DC

## 2020-12-24 NOTE — Progress Notes (Signed)
Patient is here for follow-up hypertension.   Patient son was asked about mom HM. Frederik Pear sure there was an understandings

## 2020-12-25 NOTE — Progress Notes (Signed)
Established  Patient Office Visit  Subjective:  Patient ID: Tracey Soto, female    DOB: 1950-10-14  Age: 70 y.o. MRN: [id number]  CC:  Chief Complaint  Patient presents with   Hypertension   Follow-up    HPI Tracey Soto presents for follow up of hypertension. Denies acute complaints.   History reviewed. No pertinent past medical history.  History reviewed. No pertinent surgical history.  History reviewed. No pertinent family history.  Social History   Socioeconomic History   Marital status: Married    Spouse name: Not on file   Number of children: Not on file   Years of education: Not on file   Highest education level: Not on file  Occupational History   Not on file  Tobacco Use   Smoking status: Never   Smokeless tobacco: Never  Substance and Sexual Activity   Alcohol use: No   Drug use: No   Sexual activity: Not on file  Other Topics Concern   Not on file  Social History Narrative   Not on file   Social Determinants of Health   Financial Resource Strain: Not on file  Food Insecurity: Not on file  Transportation Needs: Not on file  Physical Activity: Not on file  Stress: Not on file  Social Connections: Not on file  Intimate Partner Violence: Not on file    ROS Review of Systems  Cardiovascular: Negative.   All other systems reviewed and are negative.  Objective:   Today's Vitals: BP (!) 172/94   Pulse 68   Temp 98.1 F (36.7 C) (Oral)   Resp 16   Wt 137 lb 9.6 oz (62.4 kg)   SpO2 96%   BMI 24.37 kg/m   Physical Exam Vitals and nursing note reviewed.  Constitutional:      General: She is not in acute distress. Cardiovascular:     Rate and Rhythm: Normal rate and regular rhythm.  Pulmonary:     Effort: Pulmonary effort is normal.     Breath sounds: Normal breath sounds.  Abdominal:     Palpations: Abdomen is soft.     Tenderness: There is no abdominal tenderness.  Musculoskeletal:     Right lower leg: No edema.     Left  lower leg: No edema.  Neurological:     Mental Status: She is alert.    Assessment & Plan:   1. Uncontrolled hypertension Elevated readings remain with minimal change. Will add amlodipine 10 mg  to regimen and monitor.  - lisinopril (ZESTRIL) 20 MG tablet; Take 1 tablet (20 mg total) by mouth daily.  Dispense: 30 tablet; Refill: 0    Outpatient Encounter Medications as of 12/24/2020  Medication Sig   amLODipine (NORVASC) 10 MG tablet Take 1 tablet (10 mg total) by mouth daily.   meclizine (ANTIVERT) 25 MG tablet Take 1 tablet (25 mg total) by mouth 3 (three) times daily as needed for dizziness or nausea.   omeprazole (PRILOSEC) 20 MG capsule Take 1 capsule (20 mg total) by mouth daily.   sucralfate (CARAFATE) 1 g tablet Take 1 tablet (1 g total) by mouth 3 (three) times daily with meals.   [DISCONTINUED] lisinopril (ZESTRIL) 20 MG tablet Take 1 tablet (20 mg total) by mouth daily.   lisinopril (ZESTRIL) 20 MG tablet Take 1 tablet (20 mg total) by mouth daily.   No facility-administered encounter medications on file as of 12/24/2020.    Follow-up: Return in about 3 weeks (around 01/14/2021).  Tommie Raymond, MD

## 2021-01-19 ENCOUNTER — Other Ambulatory Visit: Payer: Self-pay

## 2021-01-19 ENCOUNTER — Ambulatory Visit (INDEPENDENT_AMBULATORY_CARE_PROVIDER_SITE_OTHER): Payer: Self-pay | Admitting: Family Medicine

## 2021-01-19 ENCOUNTER — Encounter: Payer: Self-pay | Admitting: Family Medicine

## 2021-01-19 VITALS — BP 141/89 | HR 103 | Temp 98.6°F | Resp 16 | Wt 140.0 lb

## 2021-01-19 DIAGNOSIS — I10 Essential (primary) hypertension: Secondary | ICD-10-CM

## 2021-01-19 MED ORDER — AMLODIPINE BESYLATE 10 MG PO TABS: 10.0000 mg | ORAL_TABLET | Freq: Every day | ORAL | 0 refills | Status: DC

## 2021-01-19 MED ORDER — LISINOPRIL 20 MG PO TABS: 20.0000 mg | ORAL_TABLET | Freq: Every day | ORAL | 0 refills | Status: AC

## 2021-01-19 NOTE — Progress Notes (Signed)
Patient son said medication is making her bottom out. Patient concern about moms weight.

## 2021-01-20 NOTE — Progress Notes (Signed)
Established Patient Office Visit  Subjective:  Patient ID: Tracey Soto, female    DOB: 04-19-1950  Age: 70 y.o. MRN: [id number]  CC:  Chief Complaint  Patient presents with   Hypertension    HPI Tracey Soto presents for follow up of hypertension. Reports some intermittent dizziness after both meds are taken. Reports some lower readings at home and at home machine brought today  readings seem to be pretty consistent with readings in the office. This OV was aided by interpreter.   History reviewed. No pertinent past medical history.  History reviewed. No pertinent surgical history.  History reviewed. No pertinent family history.  Social History   Socioeconomic History   Marital status: Married    Spouse name: Not on file   Number of children: Not on file   Years of education: Not on file   Highest education level: Not on file  Occupational History   Not on file  Tobacco Use   Smoking status: Never   Smokeless tobacco: Never  Substance and Sexual Activity   Alcohol use: No   Drug use: No   Sexual activity: Not on file  Other Topics Concern   Not on file  Social History Narrative   Not on file   Social Determinants of Health   Financial Resource Strain: Not on file  Food Insecurity: Not on file  Transportation Needs: Not on file  Physical Activity: Not on file  Stress: Not on file  Social Connections: Not on file  Intimate Partner Violence: Not on file    ROS Review of Systems  Cardiovascular: Negative.   Neurological:  Positive for dizziness.  All other systems reviewed and are negative.  Objective:   Today's Vitals: BP (!) 141/89   Pulse (!) 103   Temp 98.6 F (37 C)   Resp 16   Wt 140 lb (63.5 kg)   SpO2 93%   BMI 24.80 kg/m   Physical Exam Vitals and nursing note reviewed.  Constitutional:      General: She is not in acute distress. Cardiovascular:     Rate and Rhythm: Normal rate and regular rhythm.  Pulmonary:     Effort: Pulmonary  effort is normal.     Breath sounds: Normal breath sounds.  Abdominal:     Palpations: Abdomen is soft.     Tenderness: There is no abdominal tenderness.  Musculoskeletal:     Right lower leg: No edema.     Left lower leg: No edema.  Neurological:     Mental Status: She is alert.    Assessment & Plan:   1. Essential hypertension Readings still slightly elevated here in office. ?[facility]. Will continue present regimen with meds being switched to evening/bedtime administration instead of morning to see if intermittent dizziness resolves. Will monitor - amLODipine (NORVASC) 10 MG tablet; Take 1 tablet (10 mg total) by mouth daily.  Dispense: 90 tablet; Refill: 0 - lisinopril (ZESTRIL) 20 MG tablet; Take 1 tablet (20 mg total) by mouth daily.  Dispense: 90 tablet; Refill: 0    Outpatient Encounter Medications as of 01/19/2021  Medication Sig   [DISCONTINUED] amLODipine (NORVASC) 10 MG tablet Take 1 tablet (10 mg total) by mouth daily.   [DISCONTINUED] lisinopril (ZESTRIL) 20 MG tablet Take 1 tablet (20 mg total) by mouth daily.   [DISCONTINUED] meclizine (ANTIVERT) 25 MG tablet Take 1 tablet (25 mg total) by mouth 3 (three) times daily as needed for dizziness or nausea.   [DISCONTINUED] omeprazole (PRILOSEC) 20  MG capsule Take 1 capsule (20 mg total) by mouth daily.   [DISCONTINUED] sucralfate (CARAFATE) 1 g tablet Take 1 tablet (1 g total) by mouth 3 (three) times daily with meals.   amLODipine (NORVASC) 10 MG tablet Take 1 tablet (10 mg total) by mouth daily.   lisinopril (ZESTRIL) 20 MG tablet Take 1 tablet (20 mg total) by mouth daily.   No facility-administered encounter medications on file as of 01/19/2021.    Follow-up: Return in about 10 weeks (around 03/30/2021).   Tommie Raymond, MD

## 2021-03-30 ENCOUNTER — Ambulatory Visit: Payer: Self-pay | Admitting: Family Medicine

## 2022-03-11 ENCOUNTER — Emergency Department (HOSPITAL_BASED_OUTPATIENT_CLINIC_OR_DEPARTMENT_OTHER): Payer: BC Managed Care – PPO

## 2022-03-11 ENCOUNTER — Other Ambulatory Visit: Payer: Self-pay

## 2022-03-11 ENCOUNTER — Encounter (HOSPITAL_BASED_OUTPATIENT_CLINIC_OR_DEPARTMENT_OTHER): Payer: Self-pay | Admitting: Emergency Medicine

## 2022-03-11 ENCOUNTER — Emergency Department (HOSPITAL_BASED_OUTPATIENT_CLINIC_OR_DEPARTMENT_OTHER)
Admission: EM | Admit: 2022-03-11 | Discharge: 2022-03-11 | Disposition: A | Discharge status: Home / Self Care | Payer: BC Managed Care – PPO | Attending: Emergency Medicine | Admitting: Emergency Medicine

## 2022-03-11 DIAGNOSIS — I10 Essential (primary) hypertension: Secondary | ICD-10-CM | POA: Insufficient documentation

## 2022-03-11 DIAGNOSIS — Z79899 Other long term (current) drug therapy: Secondary | ICD-10-CM | POA: Insufficient documentation

## 2022-03-11 DIAGNOSIS — R42 Dizziness and giddiness: Secondary | ICD-10-CM

## 2022-03-11 LAB — COMPREHENSIVE METABOLIC PANEL
ALT: 15 U/L (ref 0–44)
AST: 22 U/L (ref 15–41)
Albumin: 3.8 g/dL (ref 3.5–5.0)
Alkaline Phosphatase: 85 U/L (ref 38–126)
Anion gap: 5 (ref 5–15)
BUN: 16 mg/dL (ref 8–23)
CO2: 28 mmol/L (ref 22–32)
Calcium: 8.5 mg/dL — ABNORMAL LOW (ref 8.9–10.3)
Chloride: 108 mmol/L (ref 98–111)
Creatinine, Ser: 0.73 mg/dL (ref 0.44–1.00)
GFR, Estimated: >60 mL/min (ref >60)
Glucose, Bld: 136 mg/dL — ABNORMAL HIGH (ref 70–99)
Potassium: 3.4 mmol/L — ABNORMAL LOW (ref 3.5–5.1)
Sodium: 141 mmol/L (ref 135–145)
Total Bilirubin: 0.4 mg/dL (ref 0.3–1.2)
Total Protein: 7.7 g/dL (ref 6.5–8.1)

## 2022-03-11 LAB — URINALYSIS, ROUTINE W REFLEX MICROSCOPIC
Bilirubin Urine: NEGATIVE
Glucose, UA: NEGATIVE mg/dL
Ketones, ur: NEGATIVE mg/dL
Nitrite: NEGATIVE
Protein, ur: NEGATIVE mg/dL
Specific Gravity, Urine: 1.02 (ref 1.005–1.030)
pH: 7 (ref 5.0–8.0)

## 2022-03-11 LAB — CBC WITH DIFFERENTIAL/PLATELET
Abs Immature Granulocytes: 0.01 10*3/uL (ref 0.00–0.07)
Basophils Absolute: 0 10*3/uL (ref 0.0–0.1)
Basophils Relative: 0 %
Eosinophils Absolute: 0.1 10*3/uL (ref 0.0–0.5)
Eosinophils Relative: 2 %
HCT: 37.8 % (ref 36.0–46.0)
Hemoglobin: 12.5 g/dL (ref 12.0–15.0)
Immature Granulocytes: 0 %
Lymphocytes Relative: 39 %
Lymphs Abs: 2.4 10*3/uL (ref 0.7–4.0)
MCH: 27.5 pg (ref 26.0–34.0)
MCHC: 33.1 g/dL (ref 30.0–36.0)
MCV: 83.3 fL (ref 80.0–100.0)
Monocytes Absolute: 0.4 10*3/uL (ref 0.1–1.0)
Monocytes Relative: 7 %
Neutro Abs: 3.1 10*3/uL (ref 1.7–7.7)
Neutrophils Relative %: 52 %
Platelets: 198 10*3/uL (ref 150–400)
RBC: 4.54 MIL/uL (ref 3.87–5.11)
RDW: 13.2 % (ref 11.5–15.5)
WBC: 6 10*3/uL (ref 4.0–10.5)
nRBC: 0 % (ref 0.0–0.2)

## 2022-03-11 LAB — URINALYSIS, MICROSCOPIC (REFLEX)

## 2022-03-11 MED ORDER — MECLIZINE HCL 25 MG PO TABS
25.0000 mg | ORAL_TABLET | Freq: Once | ORAL | Status: AC
  Administered 2022-03-11: 25 mg via ORAL
  Filled 2022-03-11: qty 1

## 2022-03-11 MED ORDER — AMLODIPINE BESYLATE 5 MG PO TABS
5.0000 mg | ORAL_TABLET | Freq: Once | ORAL | Status: AC
  Administered 2022-03-11: 5 mg via ORAL
  Filled 2022-03-11: qty 1

## 2022-03-11 MED ORDER — AMLODIPINE BESYLATE 10 MG PO TABS: 10.0000 mg | ORAL_TABLET | Freq: Every day | ORAL | 2 refills | Status: AC

## 2022-03-11 NOTE — ED Provider Notes (Signed)
[facility] EMERGENCY DEPARTMENT Provider Note   CSN: [id number] Arrival date & time: 03/11/22  1705     History  Chief Complaint  Patient presents with   Dizziness    Tracey Soto is a 71 y.o. female.  Patient presents the emergency department complaining of high blood pressure and a feeling of lightheadedness which has been ongoing for a week.  Patient reports that the lightheaded feeling increases when she is standing.  The patient also complains of ongoing low back pain with difficulty walking due to pain.  She denies any known injury to the area.  She denies saddle anesthesia, urinary incontinence, urinary retention, fecal incontinence.  The patient has previously been prescribed medications for hypertension but no longer takes these medications.  Past medical history significant for hypertension.  No other past medical history on file  HPI     Home Medications Prior to Admission medications   Medication Sig Start Date End Date Taking? Authorizing Provider  amLODipine (NORVASC) 10 MG tablet Take 1 tablet (10 mg total) by mouth daily. 03/11/22 06/09/22 Yes Darrick Grinder, PA-C  lisinopril (ZESTRIL) 20 MG tablet Take 1 tablet (20 mg total) by mouth daily. 01/19/21   Georganna Skeans, MD      Allergies    Patient has no known allergies.    Review of Systems   Review of Systems  Constitutional:  Negative for fever.  Respiratory:  Negative for shortness of breath.   Cardiovascular:  Negative for chest pain.  Genitourinary:  Negative for dysuria.  Musculoskeletal:  Positive for back pain.  Neurological:  Positive for light-headedness. Negative for dizziness and syncope.    Physical Exam Updated Vital Signs BP (!) 167/89   Pulse 80   Temp 98.6 F (37 C) (Oral)   Resp (!) 24   Ht 5\' 3"  (1.6 m)   SpO2 97%   BMI 24.80 kg/m  Physical Exam Vitals and nursing note reviewed.  Constitutional:      General: She is not in acute distress.    Appearance: She is  well-developed.  HENT:     Head: Normocephalic and atraumatic.  Eyes:     Conjunctiva/sclera: Conjunctivae normal.  Cardiovascular:     Rate and Rhythm: Normal rate and regular rhythm.     Heart sounds: No murmur heard. Pulmonary:     Effort: Pulmonary effort is normal. No respiratory distress.     Breath sounds: Normal breath sounds.  Abdominal:     Palpations: Abdomen is soft.     Tenderness: There is no abdominal tenderness.  Musculoskeletal:        General: No swelling.     Cervical back: Neck supple.  Skin:    General: Skin is warm and dry.     Capillary Refill: Capillary refill takes less than 2 seconds.  Neurological:     General: No focal deficit present.     Mental Status: She is alert.     Comments: Cranial nerves II through VII, XI, XII intact.  Normal gait.  Normal heel-to-shin.  No weakness.  No slurred speech.  No sensory deficit.  Psychiatric:        Mood and Affect: Mood normal.     ED Results / Procedures / Treatments   Labs (all labs ordered are listed, but only abnormal results are displayed) Labs Reviewed  URINALYSIS, ROUTINE W REFLEX MICROSCOPIC - Abnormal; Notable for the following components:      Result Value   Hgb urine dipstick TRACE (*)  Leukocytes,Ua TRACE (*)    All other components within normal limits  URINALYSIS, MICROSCOPIC (REFLEX) - Abnormal; Notable for the following components:   Bacteria, UA FEW (*)    All other components within normal limits  CBC WITH DIFFERENTIAL/PLATELET  COMPREHENSIVE METABOLIC PANEL    EKG None  Radiology No results found.  Procedures Procedures    Medications Ordered in ED Medications  meclizine (ANTIVERT) tablet 25 mg (25 mg Oral Given 03/11/22 1824)  amLODipine (NORVASC) tablet 5 mg (5 mg Oral Given 03/11/22 1824)    ED Course/ Medical Decision Making/ A&P                           Medical Decision Making Amount and/or Complexity of Data Reviewed Labs: ordered. Radiology:  ordered.  Risk Prescription drug management.   This patient presents to the ED for concern of lightheadedness and high blood pressure, this involves an extensive number of treatment options, and is a complaint that carries with it a high risk of complications and morbidity.  The differential diagnosis includes CVA, vertigo, blood pressure issues, hypertensive urgency, and others   Co morbidities that complicate the patient evaluation  History of hypertension   Additional history obtained:  Additional history obtained from patient's son External records from outside source obtained and reviewed including primary care notes from last year where the patient was seen for hypertension.  Patient was supposed to schedule a follow-up appointment for January of this year but it appears that she never followed through.  Patient was prescribed amlodipine 10 mg.  Patient has reported lightheadedness going back to November 2022.   Lab Tests:  I Ordered, and personally interpreted labs.  The pertinent results include:  Urinalysis with trace hemoglobin, few bacteria not indicative of infection.  CBC unremarkable.   Imaging Studies ordered:  I ordered imaging studies including CT head without contrast and CT lumbar spine without contrast Images are pending   Cardiac Monitoring: / EKG:  The patient was maintained on a cardiac monitor.  I personally viewed and interpreted the cardiac monitored which showed an underlying rhythm of: sinus rhythm   Problem List / ED Course / Critical interventions / Medication management   I ordered medication including meclizine and amlodipine  I have reviewed the patients home medicines and have made adjustments as needed   Test / Admission - Considered:  Patient care being transferred to Fayrene Helper, PA-C at shift handoff.  Metabolic panel and blood count pending.  Results of CT head and CT lumbar spine pending.  Plan to likely discharge home barring  abnormal result on CT scans or lab work.  Plan to prescribe amlodipine 10 mg for uncontrolled hypertension as patient has been on this medication at this dosage before.  Will provide 2 refills and highly recommend follow-up with primary care in 1 week.  Disposition otherwise pending results of imaging and lab work.  If CT is negative anticipate discharge home        Final Clinical Impression(s) / ED Diagnoses Final diagnoses:  Dizziness  Uncontrolled hypertension    Rx / DC Orders ED Discharge Orders          Ordered    amLODipine (NORVASC) 10 MG tablet  Daily        03/11/22 1834              Pamala Duffel 03/11/22 1845    Tegeler, Canary Brim, MD 03/11/22 [phone]

## 2022-03-11 NOTE — ED Triage Notes (Signed)
Son reports patient has been feeling dizzy for a week, increasing when standing. Patient c/o lower back pain with difficulty walking. Denies any known injury.

## 2022-03-11 NOTE — Discharge Instructions (Addendum)
You were seen today and diagnosed with uncontrolled high blood pressure.  Is important that you take blood pressure medicine as prescribed.  I prescribed amlodipine 10 mg to be taken daily and I provided 2 refills.  Please contact and schedule a follow-up appointment with your primary care provider in the next week for further evaluation and management.  CT scan of your lower back shows degenerative disc disease as well as spinal stenosis.  These are chronic finding but likely contributing to your lower back pain.  You may take over-the-counter Tylenol as needed for pain and discussed this with your primary care doctor as well.

## 2022-03-11 NOTE — ED Provider Notes (Signed)
Received signout from previous provider, please see his note for complete H&P.  71 year old female here with complaints of lightheadedness and elevated blood pressure for the past week.  She also complaining of chronic lower back pain without any saddle anesthesia.  Workup today is very reassuring, labs are reassuring, head and L-spine CT independent viewed inter by me and I agree with radiologist interpretation.  Fortunately head CT scan without any acute finding.  L-spine CT shows that patient has some degenerative changes as well spinal stenosis.  This is likely chronic.  Recommend supportive care at home and follow-up with PCP for further care but otherwise patient stable for discharge.  She will be discharged home with blood pressure medication.  BP (!) 162/96   Pulse 69   Temp 98.6 F (37 C) (Oral)   Resp 19   Ht 5\' 3"  (1.6 m)   SpO2 95%   BMI 24.80 kg/m   Results for orders placed or performed during the hospital encounter of 03/11/22  Urinalysis, Routine w reflex microscopic Urine, Clean Catch  Result Value Ref Range   Color, Urine YELLOW YELLOW   APPearance CLEAR CLEAR   Specific Gravity, Urine 1.020 1.005 - 1.030   pH 7.0 5.0 - 8.0   Glucose, UA NEGATIVE NEGATIVE mg/dL   Hgb urine dipstick TRACE (A) NEGATIVE   Bilirubin Urine NEGATIVE NEGATIVE   Ketones, ur NEGATIVE NEGATIVE mg/dL   Protein, ur NEGATIVE NEGATIVE mg/dL   Nitrite NEGATIVE NEGATIVE   Leukocytes,Ua TRACE (A) NEGATIVE  Urinalysis, Microscopic (reflex)  Result Value Ref Range   RBC / HPF 0-5 0 - 5 RBC/hpf   WBC, UA 0-5 0 - 5 WBC/hpf   Bacteria, UA FEW (A) NONE SEEN   Squamous Epithelial / LPF 0-5 0 - 5   WBC Clumps PRESENT   Comprehensive metabolic panel  Result Value Ref Range   Sodium 141 135 - 145 mmol/L   Potassium 3.4 (L) 3.5 - 5.1 mmol/L   Chloride 108 98 - 111 mmol/L   CO2 28 22 - 32 mmol/L   Glucose, Bld 136 (H) 70 - 99 mg/dL   BUN 16 8 - 23 mg/dL   Creatinine, Ser 03/13/22 0.44 - 1.00 mg/dL    Calcium 8.5 (L) 8.9 - 10.3 mg/dL   Total Protein 7.7 6.5 - 8.1 g/dL   Albumin 3.8 3.5 - 5.0 g/dL   AST 22 15 - 41 U/L   ALT 15 0 - 44 U/L   Alkaline Phosphatase 85 38 - 126 U/L   Total Bilirubin 0.4 0.3 - 1.2 mg/dL   GFR, Estimated 4.09 >81 mL/min   Anion gap 5 5 - 15  CBC with Differential  Result Value Ref Range   WBC 6.0 4.0 - 10.5 K/uL   RBC 4.54 3.87 - 5.11 MIL/uL   Hemoglobin 12.5 12.0 - 15.0 g/dL   HCT >19 14.7 - 82.9 %   MCV 83.3 80.0 - 100.0 fL   MCH 27.5 26.0 - 34.0 pg   MCHC 33.1 30.0 - 36.0 g/dL   RDW 56.2 13.0 - 86.5 %   Platelets 198 150 - 400 K/uL   nRBC 0.0 0.0 - 0.2 %   Neutrophils Relative % 52 %   Neutro Abs 3.1 1.7 - 7.7 K/uL   Lymphocytes Relative 39 %   Lymphs Abs 2.4 0.7 - 4.0 K/uL   Monocytes Relative 7 %   Monocytes Absolute 0.4 0.1 - 1.0 K/uL   Eosinophils Relative 2 %   Eosinophils  Absolute 0.1 0.0 - 0.5 K/uL   Basophils Relative 0 %   Basophils Absolute 0.0 0.0 - 0.1 K/uL   Immature Granulocytes 0 %   Abs Immature Granulocytes 0.01 0.00 - 0.07 K/uL   CT Lumbar Spine Wo Contrast  Result Date: 03/11/2022 CLINICAL DATA:  Initial evaluation for lower back pain with difficulty walking. EXAM: CT LUMBAR SPINE WITHOUT CONTRAST TECHNIQUE: Multidetector CT imaging of the lumbar spine was performed without intravenous contrast administration. Multiplanar CT image reconstructions were also generated. RADIATION DOSE REDUCTION: This exam was performed according to the departmental dose-optimization program which includes automated exposure control, adjustment of the mA and/or kV according to patient size and/or use of iterative reconstruction technique. COMPARISON:  None Available. FINDINGS: Segmentation: Standard. Lowest well-formed disc space labeled the L5-S1 level. Alignment: Mild levoscoliosis. Alignment otherwise normal with preservation of the normal lumbar lordosis. No listhesis. Vertebrae: Vertebral body height maintained without acute or chronic fracture.  Visualized sacrum and pelvis intact. No worrisome osseous lesions. Paraspinal and other soft tissues: Paraspinous soft tissues demonstrate no acute finding. Mild for age aorto bi-iliac atherosclerotic disease. Disc levels: L1-2: Mild diffuse disc bulge. Mild bilateral facet hypertrophy. No spinal stenosis. Foramina remain patent. L2-3: Diffuse disc bulge with annular calcification. Disc bulging slightly asymmetric to the right. Mild facet hypertrophy. Resultant mild canal with mild to moderate right greater than left lateral recess stenosis. Mild right worse than left L2 foraminal narrowing. L3-4: Mild diffuse disc bulge, slightly asymmetric to the right. Mild to moderate right greater left facet hypertrophy. Resultant mild to moderate bilateral subarticular stenosis, slightly worse on the right. Mild to moderate bilateral L3 foraminal narrowing. L4-5: Diffuse disc bulge. Moderate facet and ligament flavum hypertrophy. Resultant moderate to severe canal with bilateral subarticular stenosis, with moderate bilateral L4 foraminal narrowing. L5-S1: Diffuse disc bulge. Severe left worse than right facet arthrosis. Resultant mild to moderate bilateral subarticular stenosis. Central canal remains patent. Moderate bilateral L5 foraminal narrowing. IMPRESSION: 1. No acute osseous abnormality within the lumbar spine. 2. Multifactorial degenerative changes at L4-5 with resultant moderate to severe canal with bilateral subarticular stenosis, with moderate bilateral L4 foraminal narrowing. 3. Additional mild to moderate bilateral subarticular stenosis at L2-3 through L5-S1 as detailed above. Aortic Atherosclerosis (ICD10-I70.0). Electronically Signed   By: Rise Mu M.D.   On: 03/11/2022 19:18   CT Head Wo Contrast  Result Date: 03/11/2022 CLINICAL DATA:  Dizziness. EXAM: CT HEAD WITHOUT CONTRAST TECHNIQUE: Contiguous axial images were obtained from the base of the skull through the vertex without intravenous  contrast. RADIATION DOSE REDUCTION: This exam was performed according to the departmental dose-optimization program which includes automated exposure control, adjustment of the mA and/or kV according to patient size and/or use of iterative reconstruction technique. COMPARISON:  Head CT dated 04/04/2015. FINDINGS: Brain: The ventricles and sulci are appropriate size for the patient's age. The gray-white matter discrimination is preserved. There is no acute intracranial hemorrhage. No mass effect or midline shift. No extra-axial fluid collection. Vascular: No hyperdense vessel or unexpected calcification. Skull: Normal. Negative for fracture or focal lesion. Sinuses/Orbits: No acute finding. Other: None IMPRESSION: No acute intracranial pathology. Electronically Signed   By: Elgie Collard M.D.   On: 03/11/2022 19:08      Fayrene Helper, PA-C 03/11/22 1951    Tegeler, Canary Brim, MD 03/11/22 [phone]

## 2022-03-11 NOTE — ED Notes (Signed)
D/c paperwork reviewed with pts family at bedside, who is interpreting for pt, per their request. No questions or concerns voiced by pt or pts family at bedside. Pt ambulatory to ED exit without assistance.

## 2022-03-14 ENCOUNTER — Telehealth: Payer: Self-pay | Admitting: *Deleted

## 2022-03-14 NOTE — Telephone Encounter (Signed)
Transition Care Management Unsuccessful Follow-up Telephone Call  Date of discharge and from where:  03/11/22 [facility]  Attempts:  1st Attempt  Reason for unsuccessful TCM follow-up call:  Left voice message

## 2022-03-16 ENCOUNTER — Telehealth: Payer: Self-pay | Admitting: *Deleted

## 2022-03-16 NOTE — Telephone Encounter (Signed)
Transition Care Management Unsuccessful Follow-up Telephone Call  Date of discharge and from where:  03/11/22 [facility]  Attempts:  2nd Attempt  Reason for unsuccessful TCM follow-up call:  Left voice message

## 2022-03-17 ENCOUNTER — Telehealth: Payer: Self-pay | Admitting: *Deleted

## 2022-03-17 NOTE — Telephone Encounter (Signed)
Transition Care Management Unsuccessful Follow-up Telephone Call  Date of discharge and from where:  03/11/22 [facility]  Attempts:  3rd Attempt  Reason for unsuccessful TCM follow-up call:  Left voice message
